# Patient Record
Sex: Female | Born: 1965
Health system: Southern US, Community
[De-identification: ages and names within clinical notes are randomized; demographics above are authoritative.]

## PROBLEM LIST (undated history)

## (undated) DIAGNOSIS — E7800 Pure hypercholesterolemia, unspecified: Secondary | ICD-10-CM

## (undated) HISTORY — DX: Pure hypercholesterolemia, unspecified: E78.00

---

## 2002-03-29 ENCOUNTER — Emergency Department (HOSPITAL_COMMUNITY): Admission: EM | Admit: 2002-03-29 | Discharge: 2002-03-29 | Payer: Self-pay | Admitting: Emergency Medicine

## 2002-03-31 ENCOUNTER — Emergency Department (HOSPITAL_COMMUNITY): Admission: EM | Admit: 2002-03-31 | Discharge: 2002-03-31 | Payer: Self-pay | Admitting: Emergency Medicine

## 2003-07-28 ENCOUNTER — Encounter: Admission: RE | Admit: 2003-07-28 | Discharge: 2003-07-28 | Payer: Self-pay | Admitting: Internal Medicine

## 2003-08-25 ENCOUNTER — Encounter: Admission: RE | Admit: 2003-08-25 | Discharge: 2003-08-25 | Payer: Self-pay | Admitting: Internal Medicine

## 2003-12-24 ENCOUNTER — Ambulatory Visit (HOSPITAL_COMMUNITY): Admission: RE | Admit: 2003-12-24 | Discharge: 2003-12-24 | Payer: Self-pay | Admitting: Gastroenterology

## 2006-02-23 ENCOUNTER — Ambulatory Visit (HOSPITAL_COMMUNITY): Admission: RE | Admit: 2006-02-23 | Discharge: 2006-02-23 | Payer: Self-pay | Admitting: Obstetrics & Gynecology

## 2006-06-07 ENCOUNTER — Ambulatory Visit: Payer: Self-pay | Admitting: Neonatology

## 2006-07-04 ENCOUNTER — Inpatient Hospital Stay (HOSPITAL_COMMUNITY): Admission: AD | Admit: 2006-07-04 | Discharge: 2006-07-06 | Payer: Self-pay | Admitting: Obstetrics

## 2006-07-05 ENCOUNTER — Encounter (INDEPENDENT_AMBULATORY_CARE_PROVIDER_SITE_OTHER): Payer: Self-pay | Admitting: *Deleted

## 2007-11-16 ENCOUNTER — Emergency Department (HOSPITAL_COMMUNITY): Admission: EM | Admit: 2007-11-16 | Discharge: 2007-11-16 | Payer: Self-pay | Admitting: Family Medicine

## 2007-11-26 ENCOUNTER — Encounter: Admission: RE | Admit: 2007-11-26 | Discharge: 2007-11-26 | Payer: Self-pay | Admitting: Internal Medicine

## 2009-01-07 ENCOUNTER — Encounter: Admission: RE | Admit: 2009-01-07 | Discharge: 2009-01-07 | Payer: Self-pay | Admitting: Internal Medicine

## 2010-11-05 NOTE — Op Note (Signed)
NAME:  Tiffany Hardy, Tiffany Hardy                           ACCOUNT NO.:  [id number]   MEDICAL RECORD NO.:  [id number]                   PATIENT TYPE:  AMB   LOCATION:  ENDO                                 FACILITY:  [facility]   PHYSICIAN:  Graylin Shiver, M.D.                DATE OF BIRTH:  [birth year]   DATE OF PROCEDURE:  12/24/2003  DATE OF DISCHARGE:                                 OPERATIVE REPORT   PROCEDURE PERFORMED:  Esophagogastroduodenoscopy with biopsy for CLO test.   ENDOSCOPIST:  Graylin Shiver, M.D.   INDICATIONS FOR PROCEDURE:  Epigastric abdominal burning pain.   Informed consent was obtained after explanation of the risks of bleeding,  infection and perforation.   PREMEDICATIONS:  Fentanyl 75 mcg IV, Versed 7.5 mg IV.   DESCRIPTION OF PROCEDURE:  With the patient in the left lateral decubitus  position the Olympus gastroscope was inserted into the oropharynx and passed  into the esophagus.  It was advanced down the esophagus and into the stomach  and into the duodenum.  The second portion and bulb of the duodenum were  normal.  The stomach looked normal in its entirety including the upper  fundus and cardia seen on retroflexion.  Biopsies were obtained from the  distal stomach for a CLO test to look for evidence of Helicobacter pylori.  The esophagus looked normal.  The esophagogastric junction was at 36 cm.  The patient tolerated the procedure well without complications.   IMPRESSION:  Normal esophagogastroduodenoscopy.   PLAN:  The patient is currently taking Prevacid which seems to help.  I  would recommend that she remain on it as long as it is helping.  It could be  that she has gastroesophageal reflux.                                               Graylin Shiver, M.D.    Germain Osgood  D:  12/24/2003  T:  12/24/2003  Job:  [id number]   cc:   Fleet Contras, M.D.  [address]  [place]  [place] [address]  Fax: [phone]

## 2010-11-05 NOTE — Discharge Summary (Signed)
Tiffany Hardy, Tiffany Hardy.:  [id number]   MEDICAL RECORD Hardy.:  [id number]          PATIENT TYPE:  INP   LOCATION:  9112                          [facility]ACILITY:  [facility]   PHYSICIAN:  Charles A. Clearance Coots, M.D.DATE OF BIRTH:  07-Feb-1966   DATE OF ADMISSION:  07/04/2006  DATE OF DISCHARGE:  07/06/2006                               DISCHARGE SUMMARY   ADMITTING DIAGNOSES:  1. Post dates pregnancy.  2. Trisomy 18.  3. Early labor.   DISCHARGE DIAGNOSES:  1. Post dates pregnancy.  2. Trisomy 18.  3. Early labor.  4. Status post normal spontaneous vaginal delivery, trisomy 58 infant      on July 05, 2006 at 2:58, Apgars of 1 at 1 minute, 2 at 5      minutes, 7 at 10 minutes, weight 2551 grams, length of 48 cm.   DISPOSITION:  Mother discharged home in good condition and infant  discharged home to be in hospice care in the home because of the trisomy  67 diagnosis and the anatomic mainly cardiac defects and the probability  of heart failure occurring.   REASON FOR ADMISSION:  A 45 year old [place] Asian female G5, P4 with  estimated date of confinement June 17, 2006.  Patient has a known  history of trisomy 18 diagnosis during her prenatal care and had been  counseled fully by maternal fetal medicine at [facility] regarding  the survivability of this fetus   PAST MEDICAL HISTORY:  Increased cholesterol.   PAST SURGICAL HISTORY:  None.   MEDICATIONS:  Prenatal vitamins.   ALLERGIES:  Hardy KNOWN DRUG ALLERGIES   SOCIAL HISTORY:  Married.  Negative tobacco, alcohol or recreational  drug use.   PHYSICAL EXAMINATION:  VITAL SIGNS:  Afebrile, blood pressure 101/84.  LUNGS:  Clear to auscultation bilaterally.  HEART: Regular rate and rhythm.  ABDOMEN:  Gravid, nontender.  Cervix 3 cm, 80% effaced and vertex at -3  station.  Informal ultrasound revealed vertex presentation.   ADMITTING LABORATORY VALUES:  Hemoglobin 13.4, hematocrit 40, white  blood cell count  14,600, platelets 199,000.   HOSPITAL COURSE:  The patient was admitted and progressed to normal  spontaneous vaginal delivery of a female infant without complications.  The patient was taken to the operating room for postpartum tubal  ligation.  On postpartum day #0, bilateral partial salpingectomy was  performed without complications.  The remainder of the post partum and  postoperative course was uncomplicated.  The patient was discharged home  on post partum day #1 in good condition.   DISCHARGE LABORATORY VALUES:  Hemoglobin 10, hematocrit 32, white blood  cell count 10,000, platelets 185,000.   DISCHARGE MEDICATIONS:  Tylox and ibuprofen was prescribed for pain.   DISCHARGE INSTRUCTIONS:  Routine written instructions were given for  discharge after vaginal delivery.   FOLLOWUP:  The patient is to call office for follow-up appointment in 2  weeks.      Charles A. Clearance Coots, M.D.  Electronically Signed     CAH/MEDQ  D:  07/06/2006  T:  07/06/2006  Job:  [id number] 

## 2010-11-05 NOTE — Op Note (Signed)
NAMECAMELLE, HENKELS NO.:  [id number]   MEDICAL RECORD NO.:  [id number]          PATIENT TYPE:  INP   LOCATION:  9112                          [facility]ACILITY:  [facility]   PHYSICIAN:  Roseanna Rainbow, M.D.DATE OF BIRTH:  1966/04/29   DATE OF PROCEDURE:  07/05/2006  DATE OF DISCHARGE:                               OPERATIVE REPORT   PREOPERATIVE DIAGNOSIS:  Multiparity, desires sterilization procedure.   POSTOPERATIVE DIAGNOSIS:  Multiparity, desires sterilization procedure.   PROCEDURE:  Postpartum tubal ligation.   SURGEON:  Roseanna Rainbow, M.D.   PATHOLOGY:  Portions of fallopian tubes.   ESTIMATED BLOOD LOSS:  Minimal.   COMPLICATIONS:  None.   DESCRIPTION OF PROCEDURE:  The patient was taken to the operating room  with an IV running and epidural catheter in situ.  She was placed in the  dorsal supine position and prepped and draped in the usual sterile  fashion.  An infraumbilical skin incision was then made with scalpel and  carried down to the underlying fascia.  The fascia was tented up and  incised with the curved Mayo scissors.  The parietal peritoneum was  tented up and entered sharply.  The peritoneal incision was felt to be  free of adhesions.  Retractors were then placed as well as a moistened  laparotomy sponge into the abdomen.  The right fallopian tube was then  grasped with a Babcock clamp and followed out to the fimbriated end.  During this process, a window was inadvertently made in the mesosalpinx  underlying the mid isthmic portion of the tube.  A 2 cm segment of tube  was then ligated with 0 plain and excised.  The tube was returned to the  abdomen.  Excellent hemostasis was noted.  The left fallopian tube was  then manipulated in a similar fashion.  Again, a 2 cm segment of the mid  isthmic portion of the tube was doubly ligated with 0 plain and excised.  Adequate hemostasis was noted and the tube was returned to the abdomen.  The  fascia and parietal peritoneum were reapproximated in the same  single closure using a running suture of 0 Vicryl.  The skin was  reapproximated in a subcuticular fashion with 3-0 Vicryl.  At the close  of the procedure, the instrument and pack counts were said to be correct  x2.  The patient was taken to the PACU awake in stable condition.      Roseanna Rainbow, M.D.  Electronically Signed     LAJ/MEDQ  D:  07/05/2006  T:  07/05/2006  Job:  [id number]

## 2011-03-16 LAB — CULTURE, ROUTINE-ABSCESS: Gram Stain: NONE SEEN

## 2012-09-07 ENCOUNTER — Encounter (HOSPITAL_COMMUNITY): Payer: Self-pay | Admitting: Emergency Medicine

## 2012-09-07 ENCOUNTER — Emergency Department (HOSPITAL_COMMUNITY)
Admission: EM | Admit: 2012-09-07 | Discharge: 2012-09-07 | Disposition: A | Discharge status: Home / Self Care | Payer: BC Managed Care – PPO | Source: Home / Self Care | Attending: Family Medicine | Admitting: Family Medicine

## 2012-09-07 DIAGNOSIS — L089 Local infection of the skin and subcutaneous tissue, unspecified: Secondary | ICD-10-CM

## 2012-09-07 MED ORDER — TRAMADOL HCL 50 MG PO TABS: 50.0000 mg | ORAL_TABLET | Freq: Four times a day (QID) | ORAL | Status: DC | PRN

## 2012-09-07 MED ORDER — IBUPROFEN 600 MG PO TABS: 600.0000 mg | ORAL_TABLET | Freq: Three times a day (TID) | ORAL | Status: DC

## 2012-09-07 MED ORDER — DOXYCYCLINE HYCLATE 100 MG PO CAPS: 100.0000 mg | ORAL_CAPSULE | Freq: Two times a day (BID) | ORAL | Status: DC

## 2012-09-07 NOTE — ED Notes (Signed)
Pt c/o cyst on right breast since Monday. Area is red and swollen and painful to touch. No drainage present. Pt states "pain and swelling started on Wednesday" Pt has been taking aleve with no relief of symptoms.

## 2012-09-08 DIAGNOSIS — L723 Sebaceous cyst: Secondary | ICD-10-CM

## 2012-09-08 NOTE — ED Provider Notes (Signed)
History     CSN: [id number]  Arrival date & time 09/07/12  1720   First MD Initiated Contact with Patient 09/07/12 1729      Chief Complaint  Patient presents with  . Cyst    right breast since monday    (Consider location/radiation/quality/duration/timing/severity/associated sxs/prior treatment) HPI Comments: 47 y/o female with h/o sebaceus cyst in right breast for several years. Here c/o increased size, tenderness and fluctuation for 3 days. No spontaneous drainage. No fever or chills. Was seen here with similar complains in 2009 as per reccords. Had a normal mammography last time in 2010 as per records. Taking alive with minimal improvement.    History reviewed. No pertinent past medical history.  History reviewed. No pertinent past surgical history.  History reviewed. No pertinent family history.  History  Substance Use Topics  . Smoking status: Never Smoker   . Smokeless tobacco: Not on file  . Alcohol Use: No    OB History   Grav Para Term Preterm Abortions TAB SAB Ect Mult Living                  Review of Systems  Constitutional: Negative for fever and chills.  Skin:       As per HPI  Neurological: Negative for headaches.  All other systems reviewed and are negative.    Allergies  Review of patient's allergies indicates no known allergies.  Home Medications   Current Outpatient Rx  Name  Route  Sig  Dispense  Refill  . doxycycline (VIBRAMYCIN) 100 MG capsule   Oral   Take 1 capsule (100 mg total) by mouth 2 (two) times daily.   20 capsule   0   . ibuprofen (ADVIL,MOTRIN) 600 MG tablet   Oral   Take 1 tablet (600 mg total) by mouth 3 (three) times daily.   20 tablet   0   . traMADol (ULTRAM) 50 MG tablet   Oral   Take 1 tablet (50 mg total) by mouth every 6 (six) hours as needed for pain.   15 tablet   0     BP 137/84  Pulse 86  Temp(Src) 98.4 F (36.9 C) (Oral)  Resp 18  SpO2 100%  LMP 08/15/2012  Physical Exam  Nursing note  and vitals reviewed. Constitutional: She is oriented to person, place, and time. She appears well-developed and well-nourished. No distress.  Cardiovascular: Normal heart sounds.   Pulmonary/Chest: Breath sounds normal.  Lymphadenopathy:    She has no cervical adenopathy.  Neurological: She is alert and oriented to person, place, and time.  Skin: She is not diaphoretic.  There is a 3-4cm sebaceus cyst in left lower quadrant of the right breast. Cyst have focal erythema around with central fluctuation and tenderness. No striking erythema. Otherwise left breast exam including areola nipple and rest of the skin appears normal.     ED Course  INCISION AND DRAINAGE Date/Time: 09/08/2012 6:04 PM Performed by: Sharin Grave Authorized by: Sharin Grave Consent: Verbal consent obtained. Risks and benefits: risks, benefits and alternatives were discussed Consent given by: patient Patient understanding: patient states understanding of the procedure being performed Type: abscess Body area: trunk Location details: right breast Anesthesia: local infiltration Local anesthetic: lidocaine 1% with epinephrine Anesthetic total: 3 ml Scalpel size: 11 Incision type: single straight Complexity: simple Drainage: purulent (and sebaceus) Drainage amount: moderate Wound treatment: wound left open Packing material: none Patient tolerance: Patient tolerated the procedure well with no immediate complications. Comments: Applied  antibiotic ointment and dry dressing.    (including critical care time)  Labs Reviewed  CULTURE, ROUTINE-ABSCESS   No results found.   1. Infected sebaceous cyst       MDM  Absedated sebaceus cyst. S/p I&D today. Will likely require surgical referral for complete excision of entire cyst and capsule to avoid recurrence.  No packing applied after I&D. Culture pending. Asked to return in 48 h for follow up or earlier if worsening symptoms. Prescribed doxycycline,  ibuprofen and tramadol. Wound care instructions discussed and provided in writing.         Sharin Grave, MD 09/08/12 1610

## 2012-09-09 ENCOUNTER — Encounter (HOSPITAL_COMMUNITY): Payer: Self-pay | Admitting: Emergency Medicine

## 2012-09-09 ENCOUNTER — Emergency Department (INDEPENDENT_AMBULATORY_CARE_PROVIDER_SITE_OTHER)
Admission: EM | Admit: 2012-09-09 | Discharge: 2012-09-09 | Disposition: A | Discharge status: Home / Self Care | Payer: BC Managed Care – PPO | Source: Home / Self Care | Attending: Family Medicine | Admitting: Family Medicine

## 2012-09-09 DIAGNOSIS — L723 Sebaceous cyst: Secondary | ICD-10-CM

## 2012-09-09 DIAGNOSIS — L089 Local infection of the skin and subcutaneous tissue, unspecified: Secondary | ICD-10-CM

## 2012-09-09 NOTE — ED Provider Notes (Signed)
History     CSN: [id number]  Arrival date & time 09/09/12  1420   First MD Initiated Contact with Patient 09/09/12 1505      Chief Complaint  Patient presents with  . Follow-up    follow up on cyst on right breast    (Consider location/radiation/quality/duration/timing/severity/associated sxs/prior treatment) HPI Comments: 47 year old female here for followup of a sebaceous cyst infected that was I&D by me 2 days ago. Reports feeling well, no fever or chills, denies pain, reports decreased swelling and redness no longer draining. Taking doxycycline without side effects. Abscess culture with no growth for 1 day.    History reviewed. No pertinent past medical history.  History reviewed. No pertinent past surgical history.  History reviewed. No pertinent family history.  History  Substance Use Topics  . Smoking status: Never Smoker   . Smokeless tobacco: Not on file  . Alcohol Use: No    OB History   Grav Para Term Preterm Abortions TAB SAB Ect Mult Living                  Review of Systems  Constitutional: Negative for fever and chills.  Skin: Positive for wound.    Allergies  Review of patient's allergies indicates no known allergies.  Home Medications   Current Outpatient Rx  Name  Route  Sig  Dispense  Refill  . doxycycline (VIBRAMYCIN) 100 MG capsule   Oral   Take 1 capsule (100 mg total) by mouth 2 (two) times daily.   20 capsule   0   . ibuprofen (ADVIL,MOTRIN) 600 MG tablet   Oral   Take 1 tablet (600 mg total) by mouth 3 (three) times daily.   20 tablet   0   . traMADol (ULTRAM) 50 MG tablet   Oral   Take 1 tablet (50 mg total) by mouth every 6 (six) hours as needed for pain.   15 tablet   0     BP 136/81  Pulse 79  Temp(Src) 98.5 F (36.9 C) (Oral)  Resp 18  SpO2 98%  LMP 08/15/2012  Physical Exam  Nursing note and vitals reviewed. Constitutional: She is oriented to person, place, and time. She appears well-developed and  well-nourished.  Cardiovascular: Normal heart sounds.   Pulmonary/Chest: Breath sounds normal.  Lymphadenopathy:    She has no cervical adenopathy.  Neurological: She is alert and oriented to person, place, and time.  Skin:  Right lower external quadrant sebaceus cyst s/p I&D no tenderness, almost resolved erythema. Wound close no fluctuations. Healing well.     ED Course  Procedures (including critical care time)  Labs Reviewed - No data to display No results found.   1. Infected sebaceous cyst of skin       MDM  Infected sebaceous cyst status post incision and drainage 48 hours ago healing well. Continue wound care and oral antibiotics. Return as needed. Patient was provided contact information for [facility] if complete excision of her sebaceous cyst is desired once infection resolved.  Sharin Grave, MD 09/10/12 [phone]

## 2012-09-09 NOTE — ED Notes (Signed)
Pt here for follow up on cyst on right breast. Pt states that she is not having any pain but the area is a little itchy.  Pt is taking meds as prescribed. No other concerns.

## 2012-09-11 LAB — CULTURE, ROUTINE-ABSCESS

## 2012-09-11 NOTE — ED Notes (Signed)
Abscess culture R breast: Multiple organisms present, none predominant. Vassie Moselle 09/11/2012

## 2013-08-12 ENCOUNTER — Other Ambulatory Visit: Payer: Self-pay | Admitting: Internal Medicine

## 2013-08-12 DIAGNOSIS — Z1231 Encounter for screening mammogram for malignant neoplasm of breast: Secondary | ICD-10-CM

## 2013-08-26 ENCOUNTER — Ambulatory Visit
Admission: RE | Admit: 2013-08-26 | Discharge: 2013-08-26 | Disposition: A | Discharge status: Home / Self Care | Payer: BC Managed Care – PPO | Source: Ambulatory Visit | Attending: Internal Medicine | Admitting: Internal Medicine

## 2013-08-26 DIAGNOSIS — Z1231 Encounter for screening mammogram for malignant neoplasm of breast: Secondary | ICD-10-CM

## 2013-12-26 ENCOUNTER — Ambulatory Visit: Payer: BC Managed Care – PPO | Admitting: Obstetrics & Gynecology

## 2014-02-05 ENCOUNTER — Encounter: Payer: Self-pay | Admitting: Obstetrics & Gynecology

## 2014-02-05 ENCOUNTER — Ambulatory Visit (INDEPENDENT_AMBULATORY_CARE_PROVIDER_SITE_OTHER): Payer: BC Managed Care – PPO | Admitting: Obstetrics & Gynecology

## 2014-02-05 VITALS — BP 140/80 | Temp 97.7°F | Wt 167.0 lb

## 2014-02-05 DIAGNOSIS — Z01419 Encounter for gynecological examination (general) (routine) without abnormal findings: Secondary | ICD-10-CM

## 2014-02-05 NOTE — Patient Instructions (Signed)

## 2014-02-05 NOTE — Progress Notes (Signed)
Subjective:     Tiffany Hardy is a 48 y.o. female here for a routine exam.    Personal health questionnaire:  Is patient Tiffany Hardy, have a family history of breast and/or ovarian cancer: no Is there a family history of uterine cancer diagnosed at age < 7550, gastrointestinal cancer, urinary tract cancer, family member who is a Personnel officerLynch syndrome-associated carrier: no Is the patient overweight and hypertensive, family history of diabetes, personal history of gestational diabetes or PCOS: no Is patient over 2055, have PCOS,  family history of premature CHD under age 46, diabetes, smoke, have hypertension or peripheral artery disease:  no At any time, has a partner hit, kicked or otherwise hurt or frightened you?: no Over the past 2 weeks, have you felt down, depressed or hopeless?: yes Over the past 2 weeks, have you felt little interest or pleasure in doing things?:yes   Gynecologic History Patient's last menstrual period was 02/01/2014. Contraception: tubal ligation Last Pap: 2008. Results were: normal Last mammogram: 2015. Results were: normal  Obstetric History OB History  Gravida Para Term Preterm AB SAB TAB Ectopic Multiple Living  5 5 5       4     # Outcome Date GA Lbr Len/2nd Weight Sex Delivery Anes PTL Lv  5 TRM      SVD EPI  N  4 TRM      SVD None  Y  3 TRM      SVD None  Y  2 TRM      SVD None  Y  1 TRM      SVD None  Y      History reviewed. No pertinent past medical history.  History reviewed. No pertinent past surgical history.  No current outpatient prescriptions on file. No Known Allergies  History  Substance Use Topics  . Smoking status: Never Smoker   . Smokeless tobacco: Never Used  . Alcohol Use: No    History reviewed. No pertinent family history.    Review of Systems  Constitutional: negative for fatigue and weight loss Respiratory: negative for cough and wheezing Cardiovascular: negative for chest pain, fatigue and  palpitations Gastrointestinal: negative for abdominal pain and change in bowel habits Musculoskeletal:negative for myalgias Neurological: negative for gait problems and tremors Behavioral/Psych: negative for abusive relationship, depression Endocrine: negative for temperature intolerance   Genitourinary:negative for abnormal menstrual periods, genital lesions, hot flashes, sexual problems and vaginal discharge Integument/breast: negative for breast lump, breast tenderness, nipple discharge and skin lesion(s)    Objective:       BP 140/80  Temp(Src) 97.7 F (36.5 C)  Wt 75.751 kg (167 lb)  LMP 02/01/2014 General:   alert  Skin:   no rash or abnormalities  Lungs:   clear to auscultation bilaterally  Heart:   regular rate and rhythm, S1, S2 normal, no murmur, click, rub or gallop  Breasts:   normal without suspicious masses, skin or nipple changes or axillary nodes  Abdomen:  normal findings: no organomegaly, soft, non-tender and no hernia  Pelvis:  External genitalia: normal general appearance Urinary system: urethral meatus normal and bladder without fullness, nontender Vaginal: normal without tenderness, induration or masses Cervix: normal appearance Adnexa: normal bimanual exam Uterus: anteverted and non-tender, normal size   Lab Review  Labs reviewed no Radiologic studies reviewed no    Assessment:    Healthy female exam.  Depression screening tool negative   Plan:    Education reviewed: calcium supplements, low fat, low cholesterol  diet and weight bearing exercise.   Follow up as needed.

## 2014-02-05 NOTE — Addendum Note (Signed)
Addended by: Marya LandryFOSTER, Oron Westrup D on: 02/05/2014 04:50 PM   Modules accepted: Orders

## 2014-02-07 LAB — PAP IG AND HPV HIGH-RISK: HPV DNA HIGH RISK: NOT DETECTED

## 2014-04-21 ENCOUNTER — Encounter: Payer: Self-pay | Admitting: Obstetrics & Gynecology

## 2014-06-16 ENCOUNTER — Encounter: Payer: Self-pay | Admitting: *Deleted

## 2014-06-17 ENCOUNTER — Encounter: Payer: Self-pay | Admitting: Obstetrics & Gynecology

## 2014-07-21 ENCOUNTER — Other Ambulatory Visit: Payer: Self-pay

## 2014-07-21 DIAGNOSIS — Z1231 Encounter for screening mammogram for malignant neoplasm of breast: Secondary | ICD-10-CM

## 2014-08-29 ENCOUNTER — Ambulatory Visit
Admission: RE | Admit: 2014-08-29 | Discharge: 2014-08-29 | Disposition: A | Discharge status: Home / Self Care | Payer: BLUE CROSS/BLUE SHIELD | Source: Ambulatory Visit

## 2014-08-29 DIAGNOSIS — Z1231 Encounter for screening mammogram for malignant neoplasm of breast: Secondary | ICD-10-CM

## 2015-02-09 ENCOUNTER — Ambulatory Visit: Payer: BC Managed Care – PPO | Admitting: Obstetrics & Gynecology

## 2015-03-18 ENCOUNTER — Ambulatory Visit: Payer: Self-pay | Admitting: Certified Nurse Midwife

## 2015-03-20 ENCOUNTER — Ambulatory Visit: Payer: Self-pay | Admitting: Certified Nurse Midwife

## 2015-03-25 ENCOUNTER — Ambulatory Visit: Payer: Self-pay | Admitting: Certified Nurse Midwife

## 2015-04-10 ENCOUNTER — Encounter: Payer: Self-pay | Admitting: Certified Nurse Midwife

## 2015-04-10 ENCOUNTER — Ambulatory Visit (INDEPENDENT_AMBULATORY_CARE_PROVIDER_SITE_OTHER): Payer: BLUE CROSS/BLUE SHIELD | Admitting: Certified Nurse Midwife

## 2015-04-10 VITALS — BP 126/84 | HR 77 | Temp 98.4°F | Ht 60.0 in | Wt 160.8 lb

## 2015-04-10 DIAGNOSIS — Z01419 Encounter for gynecological examination (general) (routine) without abnormal findings: Secondary | ICD-10-CM | POA: Diagnosis not present

## 2015-04-10 NOTE — Progress Notes (Signed)
Patient ID: [name], female   DOB: 12-08-1965, 49 y.o.   MRN: [id number]    Subjective:      Tiffany Hardy is a 49 y.o. female here for a routine exam.  Current complaints: none.  Works full-time.  Occasionally sexually active.  From [place].  Declines blood testing for STDs.  Monthly periods, lasting 3-4 days, denies any clots or cramping.  States normal period.                 Personal health questionnaire:  Is patient [name], have a family history of breast and/or ovarian cancer: no Is there a family history of uterine cancer diagnosed at age < 73, gastrointestinal cancer, urinary tract cancer, family member who is a Personnel officer syndrome-associated carrier: no Is the patient overweight and hypertensive, family history of diabetes, personal history of gestational diabetes, preeclampsia or PCOS: no Is patient over 20, have PCOS,  family history of premature CHD under age 77, diabetes, smoke, have hypertension or peripheral artery disease:  Yes, mother HTN At any time, has a partner hit, kicked or otherwise hurt or frightened you?: no Over the past 2 weeks, have you felt down, depressed or hopeless?: no Over the past 2 weeks, have you felt little interest or pleasure in doing things?:no   Gynecologic History Patient's last menstrual period was 03/20/2015. Contraception: tubal ligation, 2008 Last Pap: 01/2014. Results were: normal Last mammogram: 08/2014. Results were: normal  Obstetric History OB History  Gravida Para Term Preterm AB SAB TAB Ectopic Multiple Living  # Outcome Date GA Lbr Len/2nd Weight Sex Delivery Anes PTL Lv  5 Term      Vag-Spont EPI  N  4 Term      Vag-Spont None  Y  3 Term      Vag-Spont None  Y  2 Term      Vag-Spont None  Y  1 Term      Vag-Spont None  Y      History reviewed. No pertinent past medical history.  History reviewed. No pertinent past surgical history.  No current outpatient prescriptions on file. No Known Allergies   Social History  Substance Use Topics  . Smoking status: Never Smoker   . Smokeless tobacco: Never Used  . Alcohol Use: No    History reviewed. No pertinent family history.    Review of Systems  Constitutional: negative for fatigue and weight loss Respiratory: negative for cough and wheezing Cardiovascular: negative for chest pain, fatigue and palpitations Gastrointestinal: negative for abdominal pain and change in bowel habits Musculoskeletal:negative for myalgias Neurological: negative for gait problems and tremors Behavioral/Psych: negative for abusive relationship, depression Endocrine: negative for temperature intolerance   Genitourinary:negative for abnormal menstrual periods, genital lesions, hot flashes, sexual problems and vaginal discharge Integument/breast: negative for breast lump, breast tenderness, nipple discharge and skin lesion(s)    Objective:       BP 126/84 mmHg  Pulse 77  Temp(Src) 98.4 F (36.9 C)  Ht 5' (1.524 m)  Wt 160 lb 12.8 oz (72.938 kg)  BMI 31.40 kg/m2  LMP 03/20/2015 General:   alert  Skin:   no rash or abnormalities  Lungs:   clear to auscultation bilaterally  Heart:   regular rate and rhythm, S1, S2 normal, no murmur, click, rub or gallop  Breasts:   normal without suspicious masses, skin or nipple changes or axillary nodes  Abdomen:  normal findings: no organomegaly, soft, non-tender and no hernia  Pelvis:  External genitalia: normal general appearance Urinary system: urethral meatus normal and bladder without fullness, nontender Vaginal: normal without tenderness, induration or masses Cervix: normal appearance Adnexa: normal bimanual exam Uterus: anteverted and non-tender, normal size   Lab Review Urine pregnancy test Labs reviewed yes Radiologic studies reviewed yes  50% of 30 min visit spent on counseling and coordination of care.   Assessment:    Healthy female exam.    Plan:    Education reviewed: calcium  supplements, depression evaluation, low fat, low cholesterol diet, safe sex/STD prevention, self breast exams, skin cancer screening and weight bearing exercise. Contraception: tubal ligation. Follow up in: 1 year.  Up to date on Mammogram, next mammogram due 3/17.   No orders of the defined types were placed in this encounter.   No orders of the defined types were placed in this encounter.

## 2015-04-15 LAB — SURESWAB BACTERIAL VAGINOSIS/ITIS
ATOPOBIUM VAGINAE: NOT DETECTED Log (cells/mL)
C. PARAPSILOSIS, DNA: NOT DETECTED
C. albicans, DNA: NOT DETECTED
C. glabrata, DNA: NOT DETECTED
C. tropicalis, DNA: NOT DETECTED
Gardnerella vaginalis: NOT DETECTED Log (cells/mL)
LACTOBACILLUS SPECIES: 7.1 Log (cells/mL)
MEGASPHAERA SPECIES: NOT DETECTED Log (cells/mL)
T. vaginalis RNA, QL TMA: NOT DETECTED

## 2015-04-15 LAB — PAP, TP IMAGING W/ HPV RNA, RFLX HPV TYPE 16,18/45: HPV mRNA, High Risk: NOT DETECTED

## 2015-10-15 ENCOUNTER — Other Ambulatory Visit: Payer: Self-pay

## 2015-10-15 DIAGNOSIS — Z1231 Encounter for screening mammogram for malignant neoplasm of breast: Secondary | ICD-10-CM

## 2015-10-30 ENCOUNTER — Ambulatory Visit
Admission: RE | Admit: 2015-10-30 | Discharge: 2015-10-30 | Disposition: A | Discharge status: Home / Self Care | Payer: BLUE CROSS/BLUE SHIELD | Source: Ambulatory Visit

## 2015-10-30 ENCOUNTER — Ambulatory Visit: Payer: BLUE CROSS/BLUE SHIELD

## 2015-10-30 DIAGNOSIS — Z1231 Encounter for screening mammogram for malignant neoplasm of breast: Secondary | ICD-10-CM

## 2015-11-02 ENCOUNTER — Other Ambulatory Visit: Payer: Self-pay | Admitting: Internal Medicine

## 2015-11-02 DIAGNOSIS — R928 Other abnormal and inconclusive findings on diagnostic imaging of breast: Secondary | ICD-10-CM

## 2015-12-14 ENCOUNTER — Ambulatory Visit
Admission: RE | Admit: 2015-12-14 | Discharge: 2015-12-14 | Disposition: A | Discharge status: Home / Self Care | Payer: BLUE CROSS/BLUE SHIELD | Source: Ambulatory Visit | Attending: Internal Medicine | Admitting: Internal Medicine

## 2015-12-14 DIAGNOSIS — N63 Unspecified lump in breast: Secondary | ICD-10-CM | POA: Diagnosis not present

## 2015-12-14 DIAGNOSIS — R928 Other abnormal and inconclusive findings on diagnostic imaging of breast: Secondary | ICD-10-CM

## 2016-04-15 ENCOUNTER — Ambulatory Visit: Payer: Self-pay | Admitting: Certified Nurse Midwife

## 2016-04-29 ENCOUNTER — Ambulatory Visit: Payer: BLUE CROSS/BLUE SHIELD | Admitting: Certified Nurse Midwife

## 2016-05-30 ENCOUNTER — Ambulatory Visit (INDEPENDENT_AMBULATORY_CARE_PROVIDER_SITE_OTHER): Payer: BLUE CROSS/BLUE SHIELD | Admitting: Obstetrics and Gynecology

## 2016-05-30 VITALS — BP 150/92 | HR 80 | Wt 180.0 lb

## 2016-05-30 DIAGNOSIS — Z01419 Encounter for gynecological examination (general) (routine) without abnormal findings: Secondary | ICD-10-CM

## 2016-05-30 NOTE — Progress Notes (Signed)
Subjective:     Tiffany Hardy is a 50 y.o. female G5P5 who is here for a comprehensive physical exam. The patient reports no problems. She is sexually active using BTL for contraception. She denies any pelvic pain or abnormal discharge  No past medical history on file. No past surgical history on file. No family history on file.   Social History   Social History  . Marital status: Married    Spouse name: N/A  . Number of children: N/A  . Years of education: N/A   Occupational History  . Not on file.   Social History Main Topics  . Smoking status: Never Smoker  . Smokeless tobacco: Never Used  . Alcohol use No  . Drug use: No  . Sexual activity: Yes    Birth control/ protection: None   Other Topics Concern  . Not on file   Social History Narrative  . No narrative on file   Health Maintenance  Topic Date Due  . HIV Screening  06/20/1982  . TETANUS/TDAP  06/20/1986  . PAP SMEAR  02/05/2017  . INFLUENZA VACCINE  Completed       Review of Systems Pertinent items are noted in HPI.   Objective:  Blood pressure (!) 150/92, pulse 80, weight 180 lb (81.6 kg), last menstrual period 05/29/2016.      GENERAL: Well-developed, well-nourished female in no acute distress.  HEENT: Normocephalic, atraumatic. Sclerae anicteric.  NECK: Supple. Normal thyroid.  LUNGS: Clear to auscultation bilaterally.  HEART: Regular rate and rhythm. BREASTS: Symmetric in size. No palpable masses or lymphadenopathy, skin changes, or nipple drainage. ABDOMEN: Soft, nontender, nondistended. No organomegaly. PELVIC: Normal external female genitalia. Vagina is pink and rugated.  Normal discharge. Normal appearing cervix. Uterus is normal in size. No adnexal mass or tenderness. EXTREMITIES: No cyanosis, clubbing, or edema, 2+ distal pulses.   Assessment:    Healthy female exam.      Plan:    Pap smear collected Patient had a normal mammogram in 11/2015 Patient denies any history of HTN. She  states that she is always nervous coming to doctors office. Advised patient to monitor and to follow up with PCP Advised patient to perform monthly self breast and vulva exam Patient will be contacted with any abnormal results RTC prn See After Visit Summary for Counseling Recommendations

## 2016-06-02 LAB — CYTOLOGY - PAP
Diagnosis: NEGATIVE
HPV: NOT DETECTED

## 2016-12-12 ENCOUNTER — Other Ambulatory Visit: Payer: Self-pay | Admitting: Internal Medicine

## 2016-12-12 DIAGNOSIS — Z1231 Encounter for screening mammogram for malignant neoplasm of breast: Secondary | ICD-10-CM

## 2017-01-02 ENCOUNTER — Ambulatory Visit
Admission: RE | Admit: 2017-01-02 | Discharge: 2017-01-02 | Disposition: A | Discharge status: Home / Self Care | Payer: BLUE CROSS/BLUE SHIELD | Source: Ambulatory Visit | Attending: Internal Medicine | Admitting: Internal Medicine

## 2017-01-02 DIAGNOSIS — Z1231 Encounter for screening mammogram for malignant neoplasm of breast: Secondary | ICD-10-CM

## 2017-02-06 ENCOUNTER — Encounter (HOSPITAL_COMMUNITY): Payer: Self-pay | Admitting: Family Medicine

## 2017-02-06 ENCOUNTER — Ambulatory Visit (HOSPITAL_COMMUNITY)
Admission: EM | Admit: 2017-02-06 | Discharge: 2017-02-06 | Disposition: A | Discharge status: Home / Self Care | Payer: BLUE CROSS/BLUE SHIELD | Attending: Family Medicine | Admitting: Family Medicine

## 2017-02-06 DIAGNOSIS — N644 Mastodynia: Secondary | ICD-10-CM

## 2017-02-06 DIAGNOSIS — N611 Abscess of the breast and nipple: Secondary | ICD-10-CM | POA: Diagnosis not present

## 2017-02-06 MED ORDER — DOXYCYCLINE HYCLATE 100 MG PO TABS: 100.0000 mg | ORAL_TABLET | Freq: Two times a day (BID) | ORAL | 0 refills | Status: DC

## 2017-02-06 NOTE — ED Triage Notes (Addendum)
Pt noticed some redness and discomfort to her right medial breast last Wednesday.  Since then it has grown in size and the pain has increased.  This is a recurrent issue in the same spot.

## 2017-02-06 NOTE — ED Provider Notes (Signed)
[facility]    CSN: [id number] Arrival date & time: 02/06/17  1344     History   Chief Complaint Chief Complaint  Patient presents with  . Abscess    HPI Tiffany Hardy is a 51 y.o. female.   Pt noticed some redness and discomfort to her right lateral breast last Wednesday (5 days ago).  Since then it has grown in size and the pain has increased.  Patient's had this problem before.  Patient works as an Psychologist, clinical.      History reviewed. No pertinent past medical history.  There are no active problems to display for this patient.   History reviewed. No pertinent surgical history.  OB History    Gravida Para Term Preterm AB Living   5 5 5     4    SAB TAB Ectopic Multiple Live Births           5       Home Medications    Prior to Admission medications   Medication Sig Start Date End Date Taking? Authorizing Provider  doxycycline (VIBRA-TABS) 100 MG tablet Take 1 tablet (100 mg total) by mouth 2 (two) times daily. 02/06/17   Elvina Sidle, MD    Family History History reviewed. No pertinent family history.  Social History Social History  Substance Use Topics  . Smoking status: Never Smoker  . Smokeless tobacco: Never Used  . Alcohol use No     Allergies   Patient has no known allergies.   Review of Systems Review of Systems  Skin: Positive for wound.  All other systems reviewed and are negative.    Physical Exam Triage Vital Signs ED Triage Vitals  Enc Vitals Group     BP      Pulse      Resp      Temp      Temp src      SpO2      Weight      Height      Head Circumference      Peak Flow      Pain Score      Pain Loc      Pain Edu?      Excl. in GC?    No data found.   Updated Vital Signs BP (!) 142/84 (BP Location: Left Arm)   Pulse 93   Temp 98.6 F (37 C) (Oral)   LMP 01/28/2017 (Approximate)   SpO2 99%   Physical Exam  Constitutional: She is oriented to person, place, and time. She  appears well-developed and well-nourished.  HENT:  Right Ear: External ear normal.  Left Ear: External ear normal.  Eyes: Pupils are equal, round, and reactive to light. Conjunctivae are normal.  Neck: Normal range of motion. Neck supple.  Pulmonary/Chest: Effort normal.  Musculoskeletal: Normal range of motion.  Neurological: She is alert and oriented to person, place, and time.  Skin: Skin is warm.  2 cm right lateral breast abscess  Nursing note and vitals reviewed.    UC Treatments / Results  Labs (all labs ordered are listed, but only abnormal results are displayed) Labs Reviewed - No data to display  EKG  EKG Interpretation None       Radiology No results found.  Procedures .Marland KitchenIncision and Drainage Date/Time: 02/06/2017 2:52 PM Performed by: Elvina Sidle Authorized by: Elvina Sidle   Consent:    Consent obtained:  Verbal   Consent given by:  Patient and  spouse   Risks discussed:  Incomplete drainage and infection   Alternatives discussed:  No treatment Location:    Type:  Abscess   Location:  Trunk   Trunk location:  R breast Pre-procedure details:    Skin preparation:  Betadine Anesthesia (see MAR for exact dosages):    Anesthesia method:  Local infiltration   Local anesthetic:  Lidocaine 1% WITH epi Procedure type:    Complexity:  Simple Procedure details:    Needle aspiration: no     Incision types:  Stab incision   Incision depth:  Subcutaneous   Scalpel blade:  11   Wound management:  Probed and deloculated   Drainage:  Purulent   Drainage amount:  Copious   Wound treatment:  Wound left open   Packing materials:  None Post-procedure details:    Patient tolerance of procedure:  Tolerated well, no immediate complications   (including critical care time)  Medications Ordered in UC Medications - No data to display   Initial Impression / Assessment and Plan / UC Course  I have reviewed the triage vital signs and the nursing  notes.  Pertinent labs & imaging results that were available during my care of the patient were reviewed by me and considered in my medical decision making (see chart for details).     Final Clinical Impressions(s) / UC Diagnoses   Final diagnoses:  Abscess of right breast    New Prescriptions New Prescriptions   DOXYCYCLINE (VIBRA-TABS) 100 MG TABLET    Take 1 tablet (100 mg total) by mouth 2 (two) times daily.     Controlled Substance Prescriptions [place] Controlled Substance Registry consulted? Not Applicable   Elvina Sidle, MD 02/06/17 1455

## 2017-02-06 NOTE — ED Notes (Signed)
Abscess drained by MD. Area cleaned and dressed with sterile 4x4s. Patient given supplies for home dressing changes.

## 2017-02-06 NOTE — Discharge Instructions (Signed)
Washed gently with warm soapy water several times a day for the next 2 days

## 2017-06-26 DIAGNOSIS — E669 Obesity, unspecified: Secondary | ICD-10-CM | POA: Diagnosis not present

## 2017-06-26 DIAGNOSIS — K219 Gastro-esophageal reflux disease without esophagitis: Secondary | ICD-10-CM | POA: Diagnosis not present

## 2017-06-26 DIAGNOSIS — J302 Other seasonal allergic rhinitis: Secondary | ICD-10-CM | POA: Diagnosis not present

## 2017-06-26 DIAGNOSIS — J029 Acute pharyngitis, unspecified: Secondary | ICD-10-CM | POA: Diagnosis not present

## 2017-08-25 DIAGNOSIS — M25562 Pain in left knee: Secondary | ICD-10-CM | POA: Diagnosis not present

## 2017-08-25 DIAGNOSIS — N611 Abscess of the breast and nipple: Secondary | ICD-10-CM | POA: Diagnosis not present

## 2017-08-25 DIAGNOSIS — J302 Other seasonal allergic rhinitis: Secondary | ICD-10-CM | POA: Diagnosis not present

## 2017-08-25 DIAGNOSIS — K219 Gastro-esophageal reflux disease without esophagitis: Secondary | ICD-10-CM | POA: Diagnosis not present

## 2017-09-04 DIAGNOSIS — J302 Other seasonal allergic rhinitis: Secondary | ICD-10-CM | POA: Diagnosis not present

## 2017-09-04 DIAGNOSIS — E7849 Other hyperlipidemia: Secondary | ICD-10-CM | POA: Diagnosis not present

## 2017-09-04 DIAGNOSIS — K219 Gastro-esophageal reflux disease without esophagitis: Secondary | ICD-10-CM | POA: Diagnosis not present

## 2017-09-04 DIAGNOSIS — M25562 Pain in left knee: Secondary | ICD-10-CM | POA: Diagnosis not present

## 2017-09-06 ENCOUNTER — Encounter: Payer: Self-pay | Admitting: *Deleted

## 2017-10-03 ENCOUNTER — Encounter: Payer: Self-pay | Admitting: Certified Nurse Midwife

## 2017-10-03 ENCOUNTER — Ambulatory Visit (INDEPENDENT_AMBULATORY_CARE_PROVIDER_SITE_OTHER): Payer: BLUE CROSS/BLUE SHIELD | Admitting: Certified Nurse Midwife

## 2017-10-03 ENCOUNTER — Other Ambulatory Visit: Payer: Self-pay

## 2017-10-03 VITALS — BP 128/81 | HR 91 | Wt 183.5 lb

## 2017-10-03 DIAGNOSIS — Z01419 Encounter for gynecological examination (general) (routine) without abnormal findings: Secondary | ICD-10-CM

## 2017-10-03 DIAGNOSIS — Z1151 Encounter for screening for human papillomavirus (HPV): Secondary | ICD-10-CM

## 2017-10-03 DIAGNOSIS — Z124 Encounter for screening for malignant neoplasm of cervix: Secondary | ICD-10-CM | POA: Diagnosis not present

## 2017-10-03 DIAGNOSIS — E65 Localized adiposity: Secondary | ICD-10-CM | POA: Diagnosis not present

## 2017-10-03 NOTE — Progress Notes (Signed)
Subjective:        Tiffany Hardy is a 52 y.o. female here for a routine exam.  Current complaints: reports weight gain.     Works full-time.  Occasionally sexually active.  From [place].  Declines blood testing for STDs.  Monthly periods, lasting 3-4 days, denies any clots or cramping.  States normal period.     reports occasional labial itching.               Personal health questionnaire:  Is patient [name], have a family history of breast and/or ovarian cancer: no Is there a family history of uterine cancer diagnosed at age < 50, gastrointestinal cancer, urinary tract cancer, family member who is a Personnel officer syndrome-associated carrier: no Is the patient overweight and hypertensive, family history of diabetes, personal history of gestational diabetes, preeclampsia or PCOS: no Is patient over 47, have PCOS,  family history of premature CHD under age 46, diabetes, smoke, have hypertension or peripheral artery disease:  Yes, mother HTN At any time, has a partner hit, kicked or otherwise hurt or frightened you?: no Over the past 2 weeks, have you felt down, depressed or hopeless?: no Over the past 2 weeks, have you felt little interest or pleasure in doing things?:no     Gynecologic History Patient's last menstrual period was 09/11/2017. Contraception: tubal ligation Last Pap: 05/30/16. Results were: normal Last mammogram: 01/03/17. Results were: normal  Obstetric History OB History  Gravida Para Term Preterm AB Living  5 5 5     4   SAB TAB Ectopic Multiple Live Births          5    # Outcome Date GA Lbr Len/2nd Weight Sex Delivery Anes PTL Lv  5 Term      Vag-Spont EPI  DEC  4 Term      Vag-Spont None  LIV  3 Term      Vag-Spont None  LIV  2 Term      Vag-Spont None  LIV  1 Term      Vag-Spont None  LIV    No past medical history on file.  No past surgical history on file.   Current Outpatient Medications:  .  diclofenac (VOLTAREN) 75 MG EC tablet, TAKE 1 TABLET BY  MOUTH TWICE A DAY WITH FOOD IF NEEDED, Disp: , Rfl: 3 No Known Allergies  Social History   Tobacco Use  . Smoking status: Never Smoker  . Smokeless tobacco: Never Used  Substance Use Topics  . Alcohol use: No    Alcohol/week: 0.0 oz    History reviewed. No pertinent family history.    Review of Systems  Constitutional: negative for fatigue and weight loss Respiratory: negative for cough and wheezing Cardiovascular: negative for chest pain, fatigue and palpitations Gastrointestinal: negative for abdominal pain and change in bowel habits Musculoskeletal:negative for myalgias Neurological: negative for gait problems and tremors Behavioral/Psych: negative for abusive relationship, depression Endocrine: negative for temperature intolerance    Genitourinary:negative for abnormal menstrual periods, genital lesions, hot flashes, sexual problems and vaginal discharge Integument/breast: negative for breast lump, breast tenderness, nipple discharge and skin lesion(s)    Objective:       BP 128/81   Pulse 91   Wt 183 lb 8 oz (83.2 kg)   LMP 09/11/2017   BMI 35.84 kg/m  General:   alert  Skin:   no rash or abnormalities  Lungs:   clear to auscultation bilaterally  Heart:   regular rate and  rhythm, S1, S2 normal, no murmur, click, rub or gallop  Breasts:   normal without suspicious masses, skin or nipple changes or axillary nodes  Abdomen:  normal findings: no organomegaly, soft, non-tender and no hernia  Pelvis:  External genitalia: normal general appearance Urinary system: urethral meatus normal and bladder without fullness, nontender Vaginal: normal without tenderness, induration or masses Cervix: normal appearance Adnexa: normal bimanual exam Uterus: anteverted and non-tender, normal size   Lab Review Urine pregnancy test Labs reviewed yes Radiologic studies reviewed yes  50% of 30 min visit spent on counseling and coordination of care.   Assessment & Plan    Healthy  female exam.   1. Well woman exam    - Cytology - PAP  2. Obese abdomen    - TSH - Hemoglobin A1c    Education reviewed: calcium supplements, depression evaluation, low fat, low cholesterol diet, safe sex/STD prevention, self breast exams, skin cancer screening and weight bearing exercise. Contraception: tubal ligation. Follow up in: 1 year.    Orders Placed This Encounter  Procedures  . TSH  . Hemoglobin A1c   Need to obtain previous records from PCP Possible management options include:  Nutrition consult Follow up as needed.

## 2017-10-03 NOTE — Progress Notes (Signed)
AEX/PAP.  C/o back and abdominal pain 4/10 x 2-3 weeks. White, itchy discharge. Denies odor, fever, NV.

## 2017-10-04 LAB — HEMOGLOBIN A1C
Est. average glucose Bld gHb Est-mCnc: 108 mg/dL
Hgb A1c MFr Bld: 5.4 % (ref 4.8–5.6)

## 2017-10-04 LAB — TSH: TSH: 3.98 u[IU]/mL (ref 0.450–4.500)

## 2017-10-05 LAB — CYTOLOGY - PAP
ADEQUACY: ABSENT
Diagnosis: NEGATIVE
HPV: NOT DETECTED

## 2017-10-16 DIAGNOSIS — E669 Obesity, unspecified: Secondary | ICD-10-CM | POA: Diagnosis not present

## 2017-10-16 DIAGNOSIS — K219 Gastro-esophageal reflux disease without esophagitis: Secondary | ICD-10-CM | POA: Diagnosis not present

## 2017-10-16 DIAGNOSIS — M545 Low back pain: Secondary | ICD-10-CM | POA: Diagnosis not present

## 2017-10-16 DIAGNOSIS — E7849 Other hyperlipidemia: Secondary | ICD-10-CM | POA: Diagnosis not present

## 2017-12-16 IMAGING — MG 2D DIGITAL SCREENING BILATERAL MAMMOGRAM WITH CAD AND ADJUNCT TO
8 of 12 series · 8 of 28 positions shown · non-contrast
Comparison: Previous exam(s).

CLINICAL DATA: Screening.

EXAM:
2D DIGITAL SCREENING BILATERAL MAMMOGRAM WITH CAD AND ADJUNCT TOMO

[R MLO synth-2D]
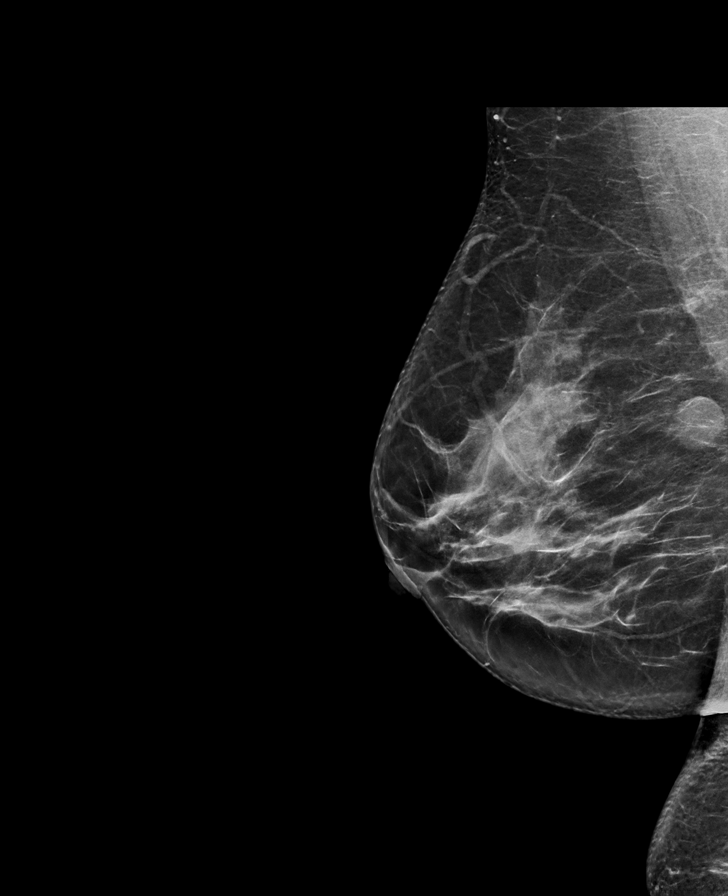

[L CC]
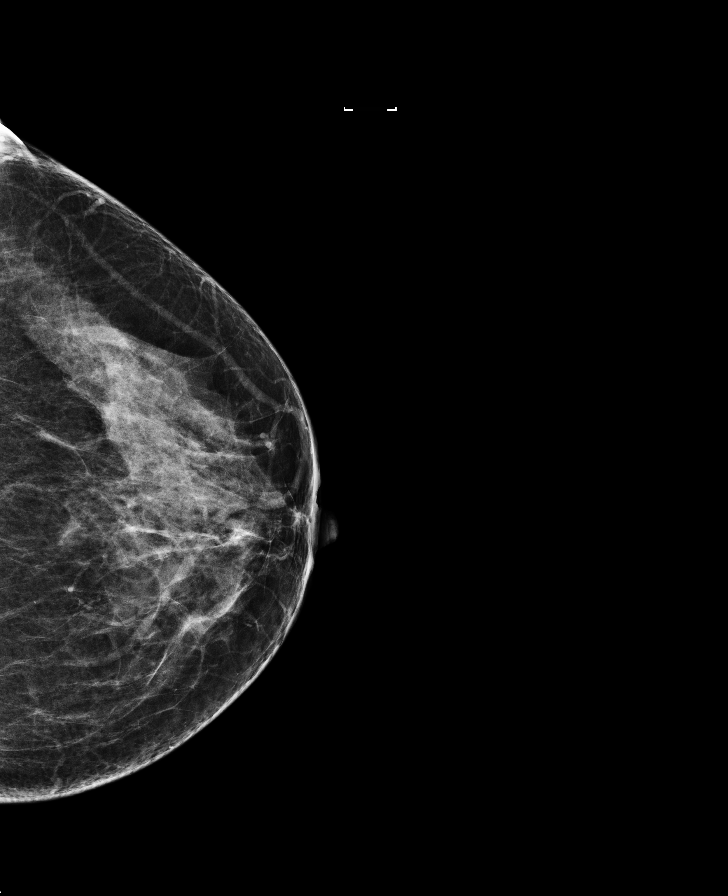

[R CC synth-2D]
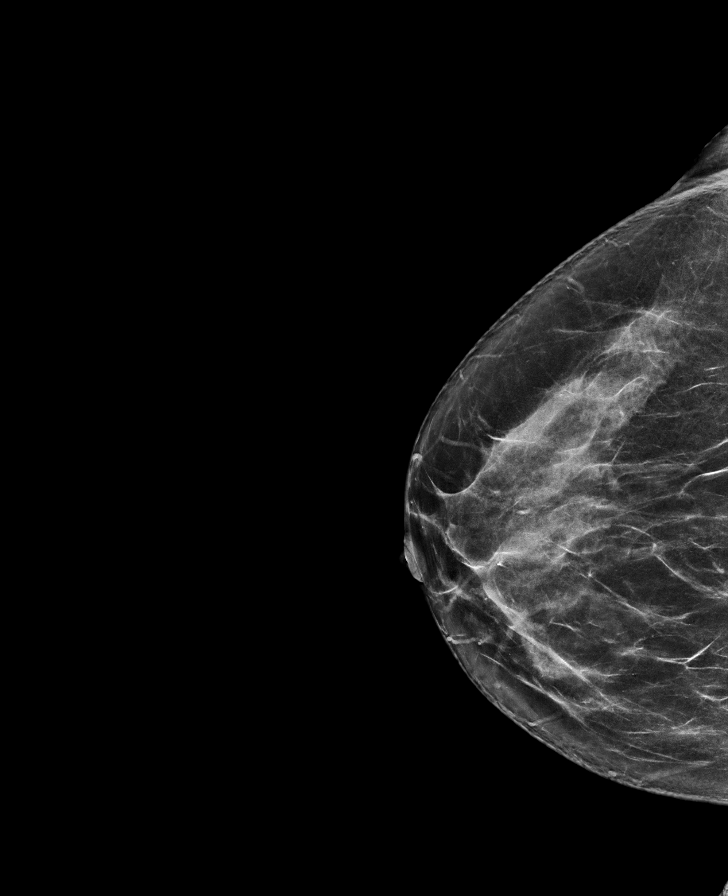

[L MLO synth-2D]
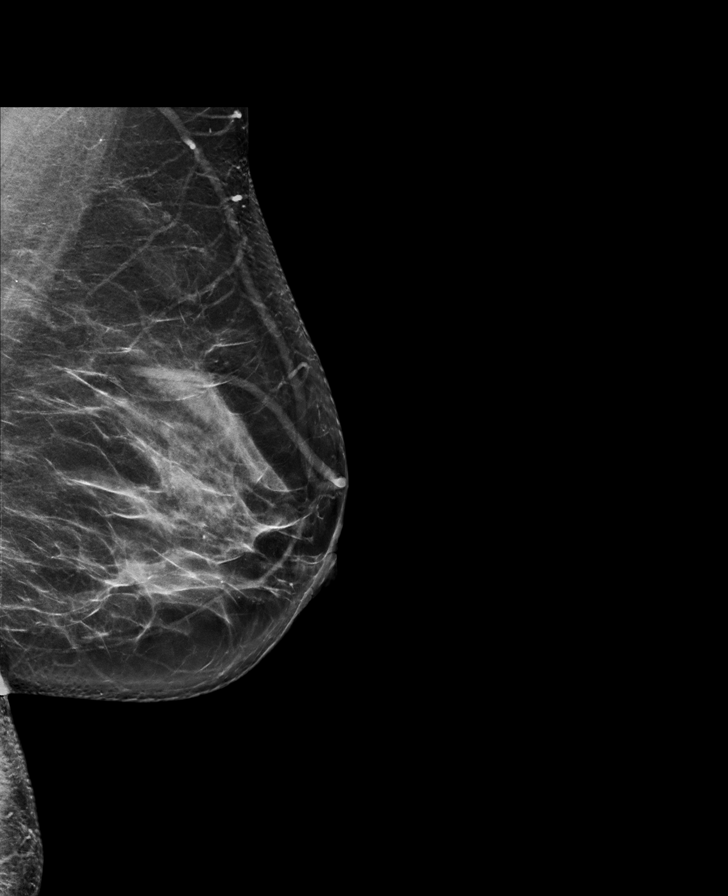

[L MLO]
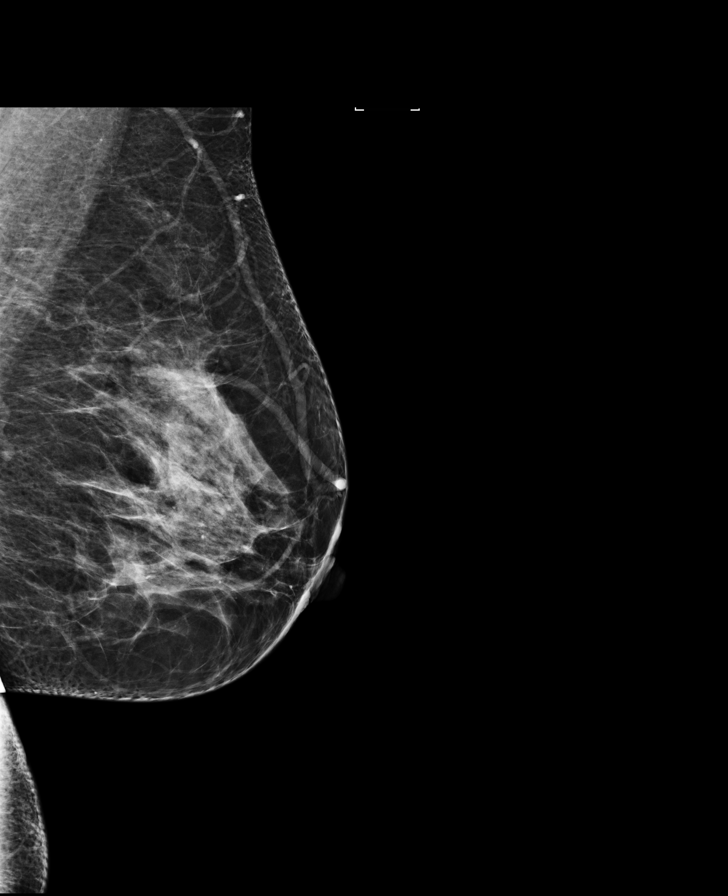

[R MLO]
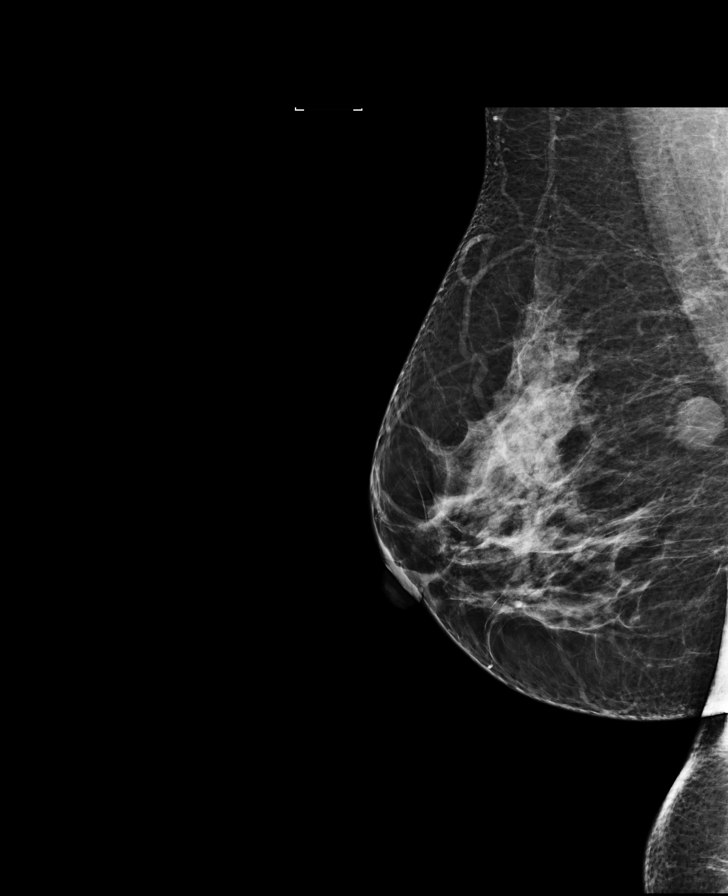

[L CC synth-2D]
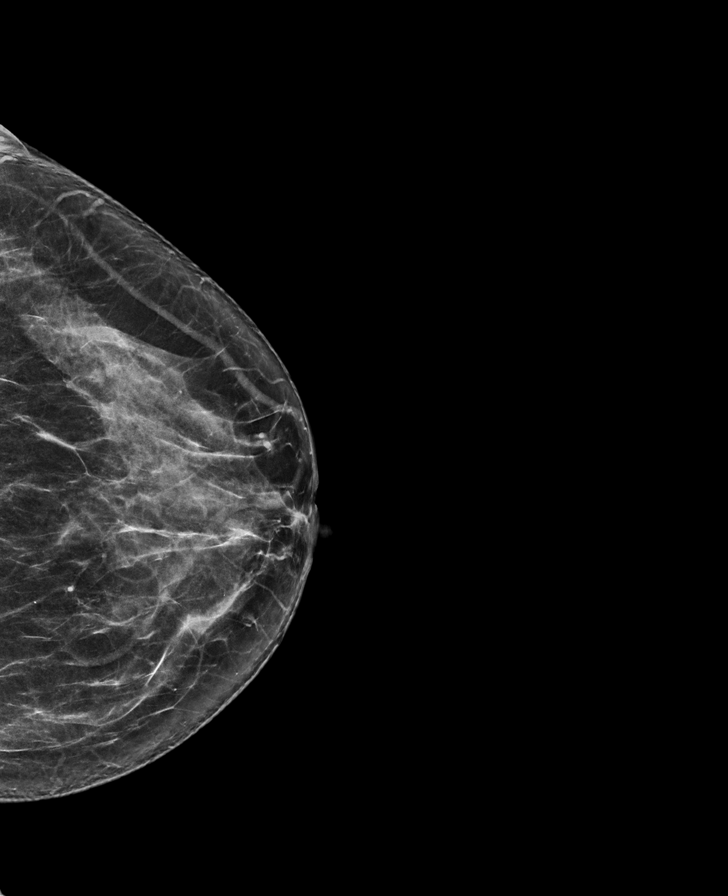

[R CC]
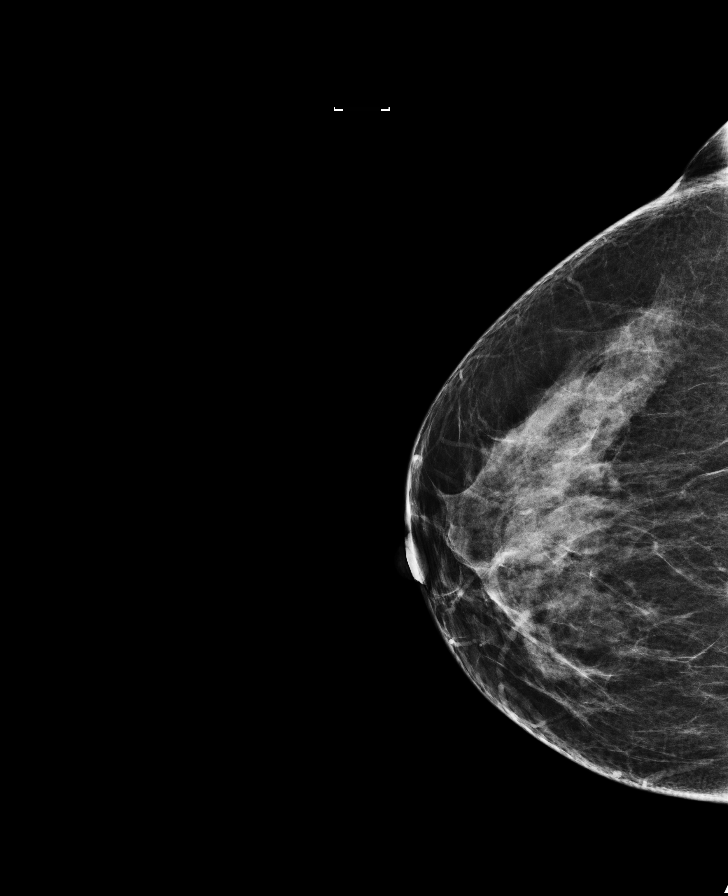

[8 of 28 positions shown; findings below may reference images not displayed]

ACR Breast Density Category c: The breast tissue is heterogeneously
dense, which may obscure small masses.
FINDINGS: There are no findings suspicious for malignancy. Images were
processed with CAD.
IMPRESSION: No mammographic evidence of malignancy. A result letter of this
screening mammogram will be mailed directly to the patient.

RECOMMENDATION:
Screening mammogram in one year. (Code:[id number])

BI-RADS CATEGORY  1: Negative.

## 2018-01-01 ENCOUNTER — Other Ambulatory Visit: Payer: Self-pay | Admitting: Internal Medicine

## 2018-01-01 DIAGNOSIS — Z1231 Encounter for screening mammogram for malignant neoplasm of breast: Secondary | ICD-10-CM

## 2018-01-08 DIAGNOSIS — E7849 Other hyperlipidemia: Secondary | ICD-10-CM | POA: Diagnosis not present

## 2018-01-08 DIAGNOSIS — K219 Gastro-esophageal reflux disease without esophagitis: Secondary | ICD-10-CM | POA: Diagnosis not present

## 2018-01-08 DIAGNOSIS — J302 Other seasonal allergic rhinitis: Secondary | ICD-10-CM | POA: Diagnosis not present

## 2018-01-08 DIAGNOSIS — M545 Low back pain: Secondary | ICD-10-CM | POA: Diagnosis not present

## 2018-01-20 ENCOUNTER — Other Ambulatory Visit: Payer: Self-pay | Admitting: Internal Medicine

## 2018-01-20 DIAGNOSIS — E2839 Other primary ovarian failure: Secondary | ICD-10-CM

## 2018-01-29 ENCOUNTER — Ambulatory Visit
Admission: RE | Admit: 2018-01-29 | Discharge: 2018-01-29 | Disposition: A | Discharge status: Home / Self Care | Payer: BLUE CROSS/BLUE SHIELD | Source: Ambulatory Visit | Attending: Internal Medicine | Admitting: Internal Medicine

## 2018-01-29 DIAGNOSIS — Z1231 Encounter for screening mammogram for malignant neoplasm of breast: Secondary | ICD-10-CM

## 2018-03-08 ENCOUNTER — Ambulatory Visit
Admission: RE | Admit: 2018-03-08 | Discharge: 2018-03-08 | Disposition: A | Discharge status: Home / Self Care | Payer: BLUE CROSS/BLUE SHIELD | Source: Ambulatory Visit | Attending: Internal Medicine | Admitting: Internal Medicine

## 2018-03-08 DIAGNOSIS — E2839 Other primary ovarian failure: Secondary | ICD-10-CM

## 2018-03-08 DIAGNOSIS — Z13820 Encounter for screening for osteoporosis: Secondary | ICD-10-CM | POA: Diagnosis not present

## 2018-03-15 ENCOUNTER — Other Ambulatory Visit: Payer: BLUE CROSS/BLUE SHIELD

## 2018-04-09 DIAGNOSIS — Z131 Encounter for screening for diabetes mellitus: Secondary | ICD-10-CM | POA: Diagnosis not present

## 2018-04-09 DIAGNOSIS — E7849 Other hyperlipidemia: Secondary | ICD-10-CM | POA: Diagnosis not present

## 2018-04-09 DIAGNOSIS — Z0000 Encounter for general adult medical examination without abnormal findings: Secondary | ICD-10-CM | POA: Diagnosis not present

## 2018-04-09 DIAGNOSIS — Z23 Encounter for immunization: Secondary | ICD-10-CM | POA: Diagnosis not present

## 2018-04-09 DIAGNOSIS — M545 Low back pain: Secondary | ICD-10-CM | POA: Diagnosis not present

## 2018-05-29 DIAGNOSIS — Z1211 Encounter for screening for malignant neoplasm of colon: Secondary | ICD-10-CM | POA: Diagnosis not present

## 2019-03-05 ENCOUNTER — Other Ambulatory Visit: Payer: Self-pay | Admitting: Internal Medicine

## 2019-03-05 DIAGNOSIS — Z1231 Encounter for screening mammogram for malignant neoplasm of breast: Secondary | ICD-10-CM

## 2019-04-19 ENCOUNTER — Ambulatory Visit
Admission: RE | Admit: 2019-04-19 | Discharge: 2019-04-19 | Disposition: A | Discharge status: Home / Self Care | Payer: BLUE CROSS/BLUE SHIELD | Source: Ambulatory Visit | Attending: Internal Medicine | Admitting: Internal Medicine

## 2019-04-19 ENCOUNTER — Other Ambulatory Visit: Payer: Self-pay

## 2019-04-19 DIAGNOSIS — Z1231 Encounter for screening mammogram for malignant neoplasm of breast: Secondary | ICD-10-CM

## 2019-05-13 ENCOUNTER — Other Ambulatory Visit: Payer: Self-pay

## 2019-05-13 ENCOUNTER — Encounter: Payer: Self-pay | Admitting: Advanced Practice Midwife

## 2019-05-13 ENCOUNTER — Ambulatory Visit (INDEPENDENT_AMBULATORY_CARE_PROVIDER_SITE_OTHER): Payer: Managed Care, Other (non HMO) | Admitting: Advanced Practice Midwife

## 2019-05-13 VITALS — BP 132/85 | HR 77 | Wt 176.0 lb

## 2019-05-13 DIAGNOSIS — N760 Acute vaginitis: Secondary | ICD-10-CM

## 2019-05-13 DIAGNOSIS — N898 Other specified noninflammatory disorders of vagina: Secondary | ICD-10-CM | POA: Diagnosis not present

## 2019-05-13 DIAGNOSIS — B373 Candidiasis of vulva and vagina: Secondary | ICD-10-CM | POA: Diagnosis not present

## 2019-05-13 DIAGNOSIS — Z01419 Encounter for gynecological examination (general) (routine) without abnormal findings: Secondary | ICD-10-CM

## 2019-05-13 DIAGNOSIS — Z113 Encounter for screening for infections with a predominantly sexual mode of transmission: Secondary | ICD-10-CM

## 2019-05-13 DIAGNOSIS — B9689 Other specified bacterial agents as the cause of diseases classified elsewhere: Secondary | ICD-10-CM | POA: Diagnosis not present

## 2019-05-13 MED ORDER — CLOBETASOL PROPIONATE 0.05 % EX OINT: 1.0000 "application " | TOPICAL_OINTMENT | Freq: Two times a day (BID) | CUTANEOUS | 0 refills | Status: DC

## 2019-05-13 MED ORDER — FLUCONAZOLE 150 MG PO TABS: 150.0000 mg | ORAL_TABLET | Freq: Once | ORAL | 0 refills | Status: AC

## 2019-05-13 NOTE — Progress Notes (Signed)
Subjective:     Tiffany Hardy is a 53 y.o. female here for a routine exam.  Current complaints: Pt with vaginal itching x 2-3 weeks with white vaginal discharge.  She reports regular menses, monthly, lasting 4 days.  She has had 1 missed period this year in the month of June and one heavier than usual period 2 months ago but all have otherwise been normal. She reports an increase in cramping abdominal and back pain with menses but pain resolves after period is over.  Personal health questionnaire reviewed: yes.  Do you have a primary care provider? Yes, last visit 03/2019 How many times per week do you exercise? 2-3 Do you feel safe at home? yes Has anyone hit, slapped, or kicked you recently? no Do you feel sad, tired, or upset most days or are you mostly happy with life? Mostly happy    Gynecologic History Patient's last menstrual period was 05/05/2019. Contraception: tubal ligation Last Pap: 09/2017. Results were: normal with negative HPV Last mammogram: 03/2019. Results were: normal  Obstetric History OB History  Gravida Para Term Preterm AB Living  5 5 5     4   SAB TAB Ectopic Multiple Live Births          5    # Outcome Date GA Lbr Len/2nd Weight Sex Delivery Anes PTL Lv  5 Term      Vag-Spont EPI  DEC  4 Term      Vag-Spont None  LIV  3 Term      Vag-Spont None  LIV  2 Term      Vag-Spont None  LIV  1 Term      Vag-Spont None  LIV     The following portions of the patient's history were reviewed and updated as appropriate: allergies, current medications, past family history, past medical history, past social history, past surgical history and problem list.  Review of Systems Pertinent items noted in HPI and remainder of comprehensive ROS otherwise negative.    Objective:  BP 132/85   Pulse 77   Wt 176 lb (79.8 kg)   LMP 05/05/2019   BMI 34.37 kg/m   VS reviewed, nursing note reviewed,  Constitutional: well developed, well nourished, no distress HEENT:  normocephalic CV: normal rate Pulm/chest wall: normal effort Breast Exam:  right breast normal without mass, skin or nipple changes or axillary nodes, left breast normal without mass, skin or nipple changes or axillary nodes Abdomen: soft Neuro: alert and oriented x 3 Skin: warm, dry Psych: affect normal Pelvic exam: On visual inspection, small amount thin white discharge at introitus and mild erythema of bilateral labia minora and vaginal walls Bimanual exam: Cervix 0/long/high, firm, anterior, neg CMT, uterus nontender, nonenlarged, adnexa without tenderness, enlargement, or mass      Assessment/Plan:   1. Vaginal discharge --Physical exam and pt symptoms c/w vaginal candida. - Cervicovaginal ancillary only( [place])  2. Well woman exam --Doing well, still has monthly menses with minimal cramping treated with Aleve.  3. Itching in the vaginal area --C/W vaginal candida --Diflucan PO plus topical steroid for irritation/discomfort --F/U in office if symptoms persist - fluconazole (DIFLUCAN) 150 MG tablet; Take 1 tablet (150 mg total) by mouth once for 1 dose.  Dispense: 1 tablet; Refill: 0 - clobetasol ointment (TEMOVATE) 0.05 %; Apply 1 application topically 2 (two) times daily. Apply to affected area  Dispense: 30 g; Refill: 0     Follow up in: 1 year or as needed.  Sharen Counter, CNM 3:40 PM

## 2019-05-20 LAB — CERVICOVAGINAL ANCILLARY ONLY
Bacterial Vaginitis (gardnerella): POSITIVE — AB
Candida Glabrata: NEGATIVE
Candida Vaginitis: POSITIVE — AB
Chlamydia: NEGATIVE
Comment: NEGATIVE
Comment: NEGATIVE
Comment: NEGATIVE
Comment: NEGATIVE
Comment: NEGATIVE
Comment: NORMAL
Neisseria Gonorrhea: NEGATIVE
Trichomonas: NEGATIVE

## 2020-04-13 ENCOUNTER — Other Ambulatory Visit: Payer: Self-pay | Admitting: Internal Medicine

## 2020-04-13 DIAGNOSIS — Z1231 Encounter for screening mammogram for malignant neoplasm of breast: Secondary | ICD-10-CM

## 2020-05-21 ENCOUNTER — Other Ambulatory Visit: Payer: Self-pay

## 2020-05-21 ENCOUNTER — Ambulatory Visit
Admission: RE | Admit: 2020-05-21 | Discharge: 2020-05-21 | Disposition: A | Discharge status: Home / Self Care | Payer: Managed Care, Other (non HMO) | Source: Ambulatory Visit | Attending: Internal Medicine | Admitting: Internal Medicine

## 2020-05-21 DIAGNOSIS — Z1231 Encounter for screening mammogram for malignant neoplasm of breast: Secondary | ICD-10-CM

## 2020-11-06 ENCOUNTER — Other Ambulatory Visit: Payer: Self-pay | Admitting: Internal Medicine

## 2020-11-07 LAB — COMPLETE METABOLIC PANEL WITH GFR
AG Ratio: 1.3 (calc) (ref 1.0–2.5)
ALT: 28 U/L (ref 6–29)
AST: 23 U/L (ref 10–35)
Albumin: 4.1 g/dL (ref 3.6–5.1)
Alkaline phosphatase (APISO): 89 U/L (ref 37–153)
BUN: 13 mg/dL (ref 7–25)
CO2: 25 mmol/L (ref 20–32)
Calcium: 9.3 mg/dL (ref 8.6–10.4)
Chloride: 103 mmol/L (ref 98–110)
Creat: 0.57 mg/dL (ref 0.50–1.05)
GFR, Est African American: 123 mL/min/{1.73_m2} (ref >=60)
GFR, Est Non African American: 106 mL/min/{1.73_m2} (ref >=60)
Globulin: 3.2 g/dL (calc) (ref 1.9–3.7)
Glucose, Bld: 85 mg/dL (ref 65–99)
Potassium: 4 mmol/L (ref 3.5–5.3)
Sodium: 138 mmol/L (ref 135–146)
Total Bilirubin: 0.5 mg/dL (ref 0.2–1.2)
Total Protein: 7.3 g/dL (ref 6.1–8.1)

## 2020-11-07 LAB — LIPID PANEL
Cholesterol: 183 mg/dL (ref <200)
HDL: 60 mg/dL (ref >=50)
LDL Cholesterol (Calc): 104 mg/dL (calc) — ABNORMAL HIGH
Non-HDL Cholesterol (Calc): 123 mg/dL (calc) (ref <130)
Total CHOL/HDL Ratio: 3.1 (calc) (ref <5.0)
Triglycerides: 93 mg/dL (ref <150)

## 2020-11-07 LAB — CBC
HCT: 42.2 % (ref 35.0–45.0)
Hemoglobin: 13.3 g/dL (ref 11.7–15.5)
MCH: 26.5 pg — ABNORMAL LOW (ref 27.0–33.0)
MCHC: 31.5 g/dL — ABNORMAL LOW (ref 32.0–36.0)
MCV: 84.2 fL (ref 80.0–100.0)
MPV: 11.4 fL (ref 7.5–12.5)
Platelets: 243 10*3/uL (ref 140–400)
RBC: 5.01 10*6/uL (ref 3.80–5.10)
RDW: 12.8 % (ref 11.0–15.0)
WBC: 5.7 10*3/uL (ref 3.8–10.8)

## 2020-11-07 LAB — VITAMIN D 25 HYDROXY (VIT D DEFICIENCY, FRACTURES): Vit D, 25-Hydroxy: 28 ng/mL — ABNORMAL LOW (ref 30–100)

## 2021-04-29 ENCOUNTER — Other Ambulatory Visit: Payer: Self-pay | Admitting: Internal Medicine

## 2021-04-29 DIAGNOSIS — Z1231 Encounter for screening mammogram for malignant neoplasm of breast: Secondary | ICD-10-CM

## 2021-06-02 ENCOUNTER — Ambulatory Visit
Admission: RE | Admit: 2021-06-02 | Discharge: 2021-06-02 | Disposition: A | Discharge status: Home / Self Care | Payer: 59 | Source: Ambulatory Visit | Attending: Internal Medicine | Admitting: Internal Medicine

## 2021-06-02 ENCOUNTER — Other Ambulatory Visit: Payer: Self-pay

## 2021-06-02 ENCOUNTER — Ambulatory Visit: Payer: Managed Care, Other (non HMO)

## 2021-06-02 DIAGNOSIS — Z1231 Encounter for screening mammogram for malignant neoplasm of breast: Secondary | ICD-10-CM

## 2021-06-04 ENCOUNTER — Other Ambulatory Visit: Payer: Self-pay | Admitting: Internal Medicine

## 2021-06-04 DIAGNOSIS — R928 Other abnormal and inconclusive findings on diagnostic imaging of breast: Secondary | ICD-10-CM

## 2021-07-30 ENCOUNTER — Other Ambulatory Visit: Payer: Self-pay | Admitting: Internal Medicine

## 2021-07-31 LAB — HEPATIC FUNCTION PANEL
AG Ratio: 1.5 (calc) (ref 1.0–2.5)
ALT: 23 U/L (ref 6–29)
AST: 18 U/L (ref 10–35)
Albumin: 4.5 g/dL (ref 3.6–5.1)
Alkaline phosphatase (APISO): 96 U/L (ref 37–153)
Bilirubin, Direct: 0.1 mg/dL (ref 0.0–0.2)
Globulin: 3.1 g/dL (calc) (ref 1.9–3.7)
Indirect Bilirubin: 0.3 mg/dL (calc) (ref 0.2–1.2)
Total Bilirubin: 0.4 mg/dL (ref 0.2–1.2)
Total Protein: 7.6 g/dL (ref 6.1–8.1)

## 2021-07-31 LAB — LIPID PANEL
Cholesterol: 181 mg/dL (ref <200)
HDL: 57 mg/dL (ref >=50)
LDL Cholesterol (Calc): 102 mg/dL (calc) — ABNORMAL HIGH
Non-HDL Cholesterol (Calc): 124 mg/dL (calc) (ref <130)
Total CHOL/HDL Ratio: 3.2 (calc) (ref <5.0)
Triglycerides: 123 mg/dL (ref <150)

## 2021-07-31 LAB — EXTRA LAV TOP TUBE

## 2021-09-16 ENCOUNTER — Ambulatory Visit: Payer: 59

## 2021-09-16 ENCOUNTER — Ambulatory Visit
Admission: RE | Admit: 2021-09-16 | Discharge: 2021-09-16 | Disposition: A | Discharge status: Home / Self Care | Payer: 59 | Source: Ambulatory Visit | Attending: Internal Medicine | Admitting: Internal Medicine

## 2021-09-16 DIAGNOSIS — R928 Other abnormal and inconclusive findings on diagnostic imaging of breast: Secondary | ICD-10-CM

## 2021-09-29 ENCOUNTER — Other Ambulatory Visit: Payer: Self-pay | Admitting: Internal Medicine

## 2021-09-30 LAB — COMPLETE METABOLIC PANEL WITH GFR
AG Ratio: 1.4 (calc) (ref 1.0–2.5)
ALT: 24 U/L (ref 6–29)
AST: 20 U/L (ref 10–35)
Albumin: 4.3 g/dL (ref 3.6–5.1)
Alkaline phosphatase (APISO): 97 U/L (ref 37–153)
BUN: 12 mg/dL (ref 7–25)
CO2: 24 mmol/L (ref 20–32)
Calcium: 9.4 mg/dL (ref 8.6–10.4)
Chloride: 103 mmol/L (ref 98–110)
Creat: 0.61 mg/dL (ref 0.50–1.03)
Globulin: 3 g/dL (calc) (ref 1.9–3.7)
Glucose, Bld: 81 mg/dL (ref 65–99)
Potassium: 4.2 mmol/L (ref 3.5–5.3)
Sodium: 138 mmol/L (ref 135–146)
Total Bilirubin: 0.3 mg/dL (ref 0.2–1.2)
Total Protein: 7.3 g/dL (ref 6.1–8.1)
eGFR: 106 mL/min/{1.73_m2} (ref >=60)

## 2021-09-30 LAB — CBC
HCT: 42.5 % (ref 35.0–45.0)
Hemoglobin: 13.5 g/dL (ref 11.7–15.5)
MCH: 26.3 pg — ABNORMAL LOW (ref 27.0–33.0)
MCHC: 31.8 g/dL — ABNORMAL LOW (ref 32.0–36.0)
MCV: 82.8 fL (ref 80.0–100.0)
MPV: 11.5 fL (ref 7.5–12.5)
Platelets: 229 10*3/uL (ref 140–400)
RBC: 5.13 10*6/uL — ABNORMAL HIGH (ref 3.80–5.10)
RDW: 13.1 % (ref 11.0–15.0)
WBC: 5.9 10*3/uL (ref 3.8–10.8)

## 2021-09-30 LAB — LIPID PANEL
Cholesterol: 310 mg/dL — ABNORMAL HIGH (ref <200)
HDL: 66 mg/dL (ref >=50)
LDL Cholesterol (Calc): 215 mg/dL (calc) — ABNORMAL HIGH
Non-HDL Cholesterol (Calc): 244 mg/dL (calc) — ABNORMAL HIGH (ref <130)
Total CHOL/HDL Ratio: 4.7 (calc) (ref <5.0)
Triglycerides: 134 mg/dL (ref <150)

## 2021-09-30 LAB — VITAMIN D 25 HYDROXY (VIT D DEFICIENCY, FRACTURES): Vit D, 25-Hydroxy: 20 ng/mL — ABNORMAL LOW (ref 30–100)

## 2021-09-30 LAB — TSH: TSH: 3.52 mIU/L

## 2022-02-11 ENCOUNTER — Ambulatory Visit (HOSPITAL_COMMUNITY)
Admission: RE | Admit: 2022-02-11 | Discharge: 2022-02-11 | Disposition: A | Discharge status: Home / Self Care | Payer: 59 | Source: Ambulatory Visit | Attending: Vascular Surgery | Admitting: Vascular Surgery

## 2022-02-11 ENCOUNTER — Other Ambulatory Visit (HOSPITAL_COMMUNITY): Payer: Self-pay | Admitting: Internal Medicine

## 2022-02-11 DIAGNOSIS — M79604 Pain in right leg: Secondary | ICD-10-CM | POA: Diagnosis present

## 2022-02-11 DIAGNOSIS — M79605 Pain in left leg: Secondary | ICD-10-CM | POA: Diagnosis not present

## 2022-04-29 ENCOUNTER — Other Ambulatory Visit: Payer: Self-pay | Admitting: Internal Medicine

## 2022-04-29 DIAGNOSIS — Z1231 Encounter for screening mammogram for malignant neoplasm of breast: Secondary | ICD-10-CM

## 2022-07-27 ENCOUNTER — Other Ambulatory Visit: Payer: Self-pay | Admitting: Internal Medicine

## 2022-07-28 LAB — LIPID PANEL
Cholesterol: 205 mg/dL — ABNORMAL HIGH (ref <200)
HDL: 68 mg/dL (ref >=50)
LDL Cholesterol (Calc): 113 mg/dL (calc) — ABNORMAL HIGH
Non-HDL Cholesterol (Calc): 137 mg/dL (calc) — ABNORMAL HIGH (ref <130)
Total CHOL/HDL Ratio: 3 (calc) (ref <5.0)
Triglycerides: 126 mg/dL (ref <150)

## 2022-07-28 LAB — COMPLETE METABOLIC PANEL WITH GFR
AG Ratio: 1.4 (calc) (ref 1.0–2.5)
ALT: 49 U/L — ABNORMAL HIGH (ref 6–29)
AST: 34 U/L (ref 10–35)
Albumin: 4.8 g/dL (ref 3.6–5.1)
Alkaline phosphatase (APISO): 118 U/L (ref 37–153)
BUN: 10 mg/dL (ref 7–25)
CO2: 27 mmol/L (ref 20–32)
Calcium: 9.9 mg/dL (ref 8.6–10.4)
Chloride: 100 mmol/L (ref 98–110)
Creat: 0.7 mg/dL (ref 0.50–1.03)
Globulin: 3.5 g/dL (calc) (ref 1.9–3.7)
Glucose, Bld: 109 mg/dL — ABNORMAL HIGH (ref 65–99)
Potassium: 4.5 mmol/L (ref 3.5–5.3)
Sodium: 139 mmol/L (ref 135–146)
Total Bilirubin: 0.5 mg/dL (ref 0.2–1.2)
Total Protein: 8.3 g/dL — ABNORMAL HIGH (ref 6.1–8.1)
eGFR: 102 mL/min/{1.73_m2} (ref >=60)

## 2022-07-28 LAB — CBC
HCT: 44.6 % (ref 35.0–45.0)
Hemoglobin: 14.8 g/dL (ref 11.7–15.5)
MCH: 27.4 pg (ref 27.0–33.0)
MCHC: 33.2 g/dL (ref 32.0–36.0)
MCV: 82.4 fL (ref 80.0–100.0)
MPV: 12.4 fL (ref 7.5–12.5)
Platelets: 222 10*3/uL (ref 140–400)
RBC: 5.41 10*6/uL — ABNORMAL HIGH (ref 3.80–5.10)
RDW: 12.6 % (ref 11.0–15.0)
WBC: 5.7 10*3/uL (ref 3.8–10.8)

## 2022-07-28 LAB — TSH: TSH: 3.76 mIU/L

## 2022-07-28 LAB — VITAMIN D 25 HYDROXY (VIT D DEFICIENCY, FRACTURES): Vit D, 25-Hydroxy: 69 ng/mL (ref 30–100)

## 2022-09-19 ENCOUNTER — Ambulatory Visit
Admission: RE | Admit: 2022-09-19 | Discharge: 2022-09-19 | Disposition: A | Discharge status: Home / Self Care | Payer: 59 | Source: Ambulatory Visit | Attending: Internal Medicine | Admitting: Internal Medicine

## 2022-09-19 DIAGNOSIS — Z1231 Encounter for screening mammogram for malignant neoplasm of breast: Secondary | ICD-10-CM

## 2023-08-25 ENCOUNTER — Other Ambulatory Visit: Payer: Self-pay | Admitting: Internal Medicine

## 2023-08-25 DIAGNOSIS — Z1231 Encounter for screening mammogram for malignant neoplasm of breast: Secondary | ICD-10-CM

## 2023-09-20 ENCOUNTER — Ambulatory Visit
Admission: RE | Admit: 2023-09-20 | Discharge: 2023-09-20 | Disposition: A | Discharge status: Home / Self Care | Source: Ambulatory Visit | Attending: Internal Medicine | Admitting: Internal Medicine

## 2023-09-20 DIAGNOSIS — Z1231 Encounter for screening mammogram for malignant neoplasm of breast: Secondary | ICD-10-CM

## 2023-11-07 ENCOUNTER — Ambulatory Visit: Admitting: Obstetrics and Gynecology

## 2023-11-07 ENCOUNTER — Encounter: Payer: Self-pay | Admitting: Obstetrics and Gynecology

## 2023-11-07 ENCOUNTER — Other Ambulatory Visit (HOSPITAL_COMMUNITY)
Admission: RE | Admit: 2023-11-07 | Discharge: 2023-11-07 | Disposition: A | Discharge status: Home / Self Care | Source: Ambulatory Visit | Attending: Obstetrics and Gynecology | Admitting: Obstetrics and Gynecology

## 2023-11-07 VITALS — BP 134/83 | HR 76 | Ht 60.0 in | Wt 177.0 lb

## 2023-11-07 DIAGNOSIS — Z01419 Encounter for gynecological examination (general) (routine) without abnormal findings: Secondary | ICD-10-CM | POA: Insufficient documentation

## 2023-11-07 DIAGNOSIS — Z1339 Encounter for screening examination for other mental health and behavioral disorders: Secondary | ICD-10-CM

## 2023-11-07 DIAGNOSIS — L309 Dermatitis, unspecified: Secondary | ICD-10-CM | POA: Insufficient documentation

## 2023-11-07 MED ORDER — TRIAMCINOLONE ACETONIDE 0.5 % EX OINT: TOPICAL_OINTMENT | CUTANEOUS | 2 refills | Status: AC

## 2023-11-07 NOTE — Progress Notes (Signed)
 Pt has not had exam in several years. Pt states she has occasional vaginal itching. Pt had Mammo in April 2025.

## 2023-11-07 NOTE — Progress Notes (Signed)
 ANNUAL EXAM Patient name: Tiffany Hardy MRN [id number]  Date of birth: [birth year] Chief Complaint:   New Patient (Initial Visit) and Annual Exam  History of Present Illness:   Tiffany Hardy is a 58 y.o. menopausal G5P5004 being seen today for a routine annual exam.  Current complaints: vulvar itching. Improving.  No PMB  Last pap 2019. Results were: NILM w/ HRHPV negative. H/O abnormal pap: no Last mammogram: 09/20/23. Results were: normal. Last colonoscopy: 2019 - normal per pt report     11/07/2023    3:21 PM 10/03/2017    2:52 PM 02/05/2014   11:28 AM  Depression screen PHQ 2/9  Decreased Interest 0 1 1  Down, Depressed, Hopeless 0 0 0  PHQ - 2 Score 0 1 1  Altered sleeping 0 0 0  Tired, decreased energy 0 0 1  Change in appetite 0 3 0  Feeling bad or failure about yourself  0 0 1  Trouble concentrating 0 0 0  Moving slowly or fidgety/restless 0 3 1  Suicidal thoughts 0 0 0  PHQ-9 Score 0 7 4  Difficult doing work/chores  Not difficult at all         11/07/2023    3:20 PM  GAD 7 : Generalized Anxiety Score  Nervous, Anxious, on Edge 0  Control/stop worrying 0  Worry too much - different things 0  Trouble relaxing 0  Restless 0  Easily annoyed or irritable 0  Afraid - awful might happen 0  Total GAD 7 Score 0     Review of Systems:   Pertinent items are noted in HPI Denies any headaches, blurred vision, fatigue, shortness of breath, chest pain, abdominal pain, abnormal vaginal discharge/itching/odor/irritation, problems with periods, bowel movements, urination, or intercourse unless otherwise stated above. Pertinent History Reviewed:  Reviewed past medical,surgical, social and family history.  Reviewed problem list, medications and allergies. Physical Assessment:   Vitals:   11/07/23 1514  BP: 134/83  Pulse: 76  Weight: 177 lb (80.3 kg)  Height: 5' (1.524 m)  Body mass index is 34.57 kg/m.        Physical Examination:   General appearance - well appearing,  and in no distress  Mental status - alert, oriented to person, place, and time  Chest - respiratory effort normal  Heart - normal peripheral perfusion  Breasts - breasts appear normal, no suspicious masses, no skin or nipple changes or axillary nodes  Abdomen - soft, nontender, nondistended, no masses or organomegaly  Pelvic - VULVA: erythema and skin thickening over the vulvar c/f lichen simplex chronicus  VAGINA: normal appearing vagina with normal color and discharge, no lesions  CERVIX: normal appearing cervix without discharge or lesions, no CMT  Thin prep pap is done with HR HPV cotesting  UTERUS: uterus is felt to be normal size, shape, consistency and nontender   ADNEXA: No adnexal masses or tenderness noted.  Chaperone present for exam  No results found for this or any previous visit (from the past 24 hours).  Assessment & Plan:  1) Well-Woman Exam Mammogram: in 1 year, or sooner if problems Colonoscopy: per GI Pap: Collected GC/CT: Declined HIV/HCV: Reports recent testing through work, will bring us  copies of result  2) Vulvar dermatitis Possible lichen simplex chronicus, no e/o cellulitis or lesion that would be concerning for VIN/malignancy Discussed vulvar care & steroid ointment, avoidance of scratching -     Cervicovaginal ancillary only( [place]) -     triamcinolone ointment (KENALOG) 0.5 %;  Apply 1 Application topically at bedtime for 14 days, THEN 1 Application 2 (two) times a week.  Labs/procedures today:   No orders of the defined types were placed in this encounter.  Meds:  Meds ordered this encounter  Medications   triamcinolone ointment (KENALOG) 0.5 %    Sig: Apply 1 Application topically at bedtime for 14 days, THEN 1 Application 2 (two) times a week.    Dispense:  15 g    Refill:  2   Follow-up: Return in about 3 months (around 02/07/2024) for follow up vulvar dermatitis.  [name], MD 11/07/2023 4:03 PM

## 2023-11-07 NOTE — Patient Instructions (Signed)
 NO: - Synthetic underwear - Tight pants - Swim suits, thongs, leotards, leggings for prolonged periods of time - Scented soap/shampoo - Bubble baths - Scented detergents, dryer sheets - Baby wipes - Feminine sprays, douches, powders - Panty liners - Dyed toilet paper - Shaving  Trying changing to: - Cotton or no underwear - Loose pants, skirts, dresses - Changing out of swimwear, thongs, and workout gear as soon as you're done exercising - Fragrance free soaps (like Dove sensitive skin) - Warm plain water baths - Unscented laundry detergent - Use a bedet or peri bottle to rinse instead of baby wipes - Tampons, cotton pads, cotton period underwear - Undyed toilet paper - Clipping hair

## 2023-11-08 LAB — CERVICOVAGINAL ANCILLARY ONLY
Candida Glabrata: NEGATIVE
Candida Vaginitis: POSITIVE — AB
Comment: NEGATIVE
Comment: NEGATIVE

## 2023-11-09 ENCOUNTER — Ambulatory Visit: Payer: Self-pay | Admitting: Obstetrics and Gynecology

## 2023-11-09 MED ORDER — FLUCONAZOLE 150 MG PO TABS: 150.0000 mg | ORAL_TABLET | Freq: Once | ORAL | 0 refills | Status: AC

## 2023-11-10 LAB — CYTOLOGY - PAP
Comment: NEGATIVE
Diagnosis: NEGATIVE
High risk HPV: NEGATIVE

## 2024-05-13 ENCOUNTER — Other Ambulatory Visit: Payer: Self-pay | Admitting: Obstetrics and Gynecology

## 2024-05-13 NOTE — Telephone Encounter (Signed)
 Needs follow up appointment (was due August)
